# Patient Record
Sex: Female | Born: 1960 | Race: Black or African American | Hispanic: No | Marital: Married
Health system: Southern US, Community
[De-identification: ages and names within clinical notes are randomized; demographics above are authoritative.]

---

## 2014-08-02 DIAGNOSIS — E782 Mixed hyperlipidemia: Secondary | ICD-10-CM | POA: Insufficient documentation

## 2015-01-24 DIAGNOSIS — R7303 Prediabetes: Secondary | ICD-10-CM | POA: Insufficient documentation

## 2015-03-30 DIAGNOSIS — IMO0001 Reserved for inherently not codable concepts without codable children: Secondary | ICD-10-CM | POA: Insufficient documentation

## 2017-12-04 DIAGNOSIS — K219 Gastro-esophageal reflux disease without esophagitis: Secondary | ICD-10-CM | POA: Insufficient documentation

## 2017-12-04 DIAGNOSIS — L2084 Intrinsic (allergic) eczema: Secondary | ICD-10-CM | POA: Insufficient documentation

## 2018-05-04 DIAGNOSIS — R5382 Chronic fatigue, unspecified: Secondary | ICD-10-CM | POA: Insufficient documentation

## 2018-05-04 DIAGNOSIS — N951 Menopausal and female climacteric states: Secondary | ICD-10-CM | POA: Insufficient documentation

## 2018-05-04 DIAGNOSIS — G4700 Insomnia, unspecified: Secondary | ICD-10-CM | POA: Insufficient documentation

## 2018-06-10 DIAGNOSIS — L568 Other specified acute skin changes due to ultraviolet radiation: Secondary | ICD-10-CM | POA: Insufficient documentation

## 2020-04-13 ENCOUNTER — Ambulatory Visit (INDEPENDENT_AMBULATORY_CARE_PROVIDER_SITE_OTHER): Payer: BC Managed Care – PPO

## 2020-04-13 ENCOUNTER — Ambulatory Visit (INDEPENDENT_AMBULATORY_CARE_PROVIDER_SITE_OTHER): Payer: BC Managed Care – PPO | Admitting: Sports Medicine

## 2020-04-13 ENCOUNTER — Other Ambulatory Visit: Payer: Self-pay

## 2020-04-13 DIAGNOSIS — G8929 Other chronic pain: Secondary | ICD-10-CM

## 2020-04-13 DIAGNOSIS — M5442 Lumbago with sciatica, left side: Secondary | ICD-10-CM | POA: Diagnosis not present

## 2020-04-13 DIAGNOSIS — M75101 Unspecified rotator cuff tear or rupture of right shoulder, not specified as traumatic: Secondary | ICD-10-CM | POA: Diagnosis not present

## 2020-04-13 MED ORDER — MELOXICAM 15 MG PO TABS: ORAL_TABLET | ORAL | 3 refills | Status: DC

## 2020-04-13 NOTE — Progress Notes (Signed)
    Procedures performed today:    None.  Independent interpretation of notes and tests performed by another provider:   None.  Brief History, Exam, Impression, and Recommendations:    Chronic low back pain This is a pleasant 59 year old female, for years she has noted asymmetry in her legs, left bigger than right. She has had the appropriate work-up including x-rays, DVT ultrasounds, everything's been negative. She has no cramping in her calf when walking, but she does complain of mild achiness in her back with radiation and a vibration type sensation down her left leg, lateral aspect. I do suspect this is likely radicular, occasionally the loss of autonomic innervation can result in some swelling of the leg. She has a negative Homans' sign today, good pulses. We will start conservatively with lumbar spine x-rays, meloxicam, formal physical therapy followed by MRI and intervention if no better.  Rotator cuff syndrome, right [place] also recalls pulling some weeds, she then developed pain in her right deltoid with overhead activities, later on she started to develop paresthesias into the dorsum of the hand to the second and third fingers. She does have a positive Neer's, Hawkins signs, positive into can sign, positive liftoff test. I think she does have rotator cuff injury, with secondary radiculitis. I did discuss the force coupling function of the rotator cuff, will do x-rays, meloxicam and formal physical therapy for 6 weeks followed by injection if no better.    ___________________________________________ Ihor Austin. Benjamin Stain, M.D., ABFM., CAQSM. Primary Care and Sports Medicine [facility]  Adjunct Instructor of Family Medicine  [employer]

## 2020-04-13 NOTE — Assessment & Plan Note (Signed)
Brianna Curtis also recalls pulling some weeds, she then developed pain in her right deltoid with overhead activities, later on she started to develop paresthesias into the dorsum of the hand to the second and third fingers. She does have a positive Neer's, Hawkins signs, positive into can sign, positive liftoff test. I think she does have rotator cuff injury, with secondary radiculitis. I did discuss the force coupling function of the rotator cuff, will do x-rays, meloxicam and formal physical therapy for 6 weeks followed by injection if no better.

## 2020-04-13 NOTE — Assessment & Plan Note (Signed)
This is a pleasant 59 year old female, for years she has noted asymmetry in her legs, left bigger than right. She has had the appropriate work-up including x-rays, DVT ultrasounds, everything's been negative. She has no cramping in her calf when walking, but she does complain of mild achiness in her back with radiation and a vibration type sensation down her left leg, lateral aspect. I do suspect this is likely radicular, occasionally the loss of autonomic innervation can result in some swelling of the leg. She has a negative Homans' sign today, good pulses. We will start conservatively with lumbar spine x-rays, meloxicam, formal physical therapy followed by MRI and intervention if no better.

## 2020-04-26 ENCOUNTER — Ambulatory Visit: Payer: BC Managed Care – PPO | Admitting: Physical Therapy

## 2020-05-03 ENCOUNTER — Ambulatory Visit (INDEPENDENT_AMBULATORY_CARE_PROVIDER_SITE_OTHER): Payer: BC Managed Care – PPO | Admitting: Physical Therapy

## 2020-05-03 ENCOUNTER — Other Ambulatory Visit: Payer: Self-pay

## 2020-05-03 ENCOUNTER — Encounter: Payer: Self-pay | Admitting: Physical Therapy

## 2020-05-03 DIAGNOSIS — M5442 Lumbago with sciatica, left side: Secondary | ICD-10-CM | POA: Diagnosis not present

## 2020-05-03 DIAGNOSIS — G8929 Other chronic pain: Secondary | ICD-10-CM

## 2020-05-03 DIAGNOSIS — M62838 Other muscle spasm: Secondary | ICD-10-CM | POA: Diagnosis not present

## 2020-05-03 DIAGNOSIS — M25511 Pain in right shoulder: Secondary | ICD-10-CM | POA: Diagnosis not present

## 2020-05-03 DIAGNOSIS — M6281 Muscle weakness (generalized): Secondary | ICD-10-CM | POA: Diagnosis not present

## 2020-05-03 NOTE — Patient Instructions (Addendum)
Access Code: [email or web address] URL: [email or web address] Date: 05/03/2020 Prepared by: Roderic Scarce  Exercises Supine Figure 4 Piriformis Stretch - 1 x daily - 1-2 reps - 30-45 sec hold Supine ITB Stretch with Strap - 1 x daily - 1-2 reps - 30-45 hold Standing Quadratus Lumborum Stretch with Doorway - 1 x daily - 1-2 reps - 30-45 hold Supine Shoulder External Rotation with Resistance - 1 x daily - 3 sets - 10 reps Supine Shoulder Horizontal Abduction with Resistance - 1 x daily - 3 sets - 10 reps Single Arm Doorway Pec Stretch at 90 Degrees Abduction - 1 x daily - 1-2 reps - 30-45 sec hold

## 2020-05-03 NOTE — Therapy (Signed)
[facility] [place] [address] [place], [place], [address] Phone: [phone]   Fax:  [phone]  Physical Therapy Evaluation  Patient Details  Name: Brianna Curtis MRN: [id number] Date of Birth: 08-15-1960 Referring Provider (PT): Dr Roderic Palau   Encounter Date: 05/03/2020   PT End of Session - 05/03/20 1016    Visit Number 1    Number of Visits 6    Date for PT Re-Evaluation 06/14/20    Authorization Type BCBS    PT Start Time 1018    PT Stop Time 1101    PT Time Calculation (min) 43 min    Activity Tolerance Patient tolerated treatment well    Behavior During Therapy [facility] for tasks assessed/performed           History reviewed. No pertinent past medical history.  History reviewed. No pertinent surgical history.  There were no vitals filed for this visit.    Subjective Assessment - 05/03/20 1016    Subjective Pt reports that her symptoms are in the Lt leg - tingling- and that leg is bigger than her Rt.  Md thinks it could be from her back.  Rt shoulder also gives her some pain when she reaches for things. This has been going on for a couple months    Patient Stated Goals reduce pain and be able to use her arm again    Currently in Pain? Yes    Pain Score 2     Pain Location Shoulder    Pain Orientation Right    Pain Descriptors / Indicators Dull;Sore    Pain Type Chronic pain    Pain Radiating Towards into elbow    Pain Onset More than a month ago    Pain Frequency Intermittent    Aggravating Factors  reaching in certain direction    Pain Relieving Factors mobic    Multiple Pain Sites --   no pain in the leg has tingling only into th eleft leg             [facility] PT Assessment - 05/03/20 0001      Assessment   Medical Diagnosis LBP and Rt shoulder pain    Referring Provider (PT) Dr Roderic Palau    Onset Date/Surgical Date 02/01/20    Hand Dominance Right    Next MD Visit PRN    Prior Therapy none        Precautions   Precautions None      Balance Screen   Has the patient fallen in the past 6 months No    Has the patient had a decrease in activity level because of a fear of falling?  No    Is the patient reluctant to leave their home because of a fear of falling?  No      Home Environment   Living Environment Private residence    Living Arrangements Spouse/significant other    Home Access Stairs to enter   aprtment     Prior Function   Level of Independence Independent    Vocation Retired;Part time employment    Buyer, retail once a wk, visual arts    Leisure walk      Observation/Other Assessments   Focus on Therapeutic Outcomes (FOTO)  37% limited      Posture/Postural Control   Posture/Postural Control Postural limitations    Postural Limitations Forward head;Increased lumbar lordosis      ROM / Strength   AROM / PROM / Strength AROM;Strength  AROM   Overall AROM Comments cervical and lumbar WNL except lumbar extension 25% present - all comes from thoracic . Bilat U/LE's WNL       Strength   Overall Strength Comments shoulders WNL except Rt flex 4/5 with pain , LE's WNL       Flexibility   Soft Tissue Assessment /Muscle Length --   Lt hip more stiff than Rt      Palpation   Spinal mobility hypomobile in sacrum and L5 with tenderness    Palpation comment tender in Lt QL and bilat SIJ,       Special Tests   Other special tests (-) SLR and slump, ( +) hawkins kennedy Rt                       Objective measurements completed on examination: See above findings.       [facility] Adult PT Treatment/Exercise - 05/03/20 0001      Exercises   Exercises Other Exercises    Other Exercises  standing QL stretch, piriformis and cross body ITB stretch Lt LE, supine bilat UE ER/horizontal abduction with green band, doorway stretch mid                   PT Education - 05/03/20 1254    Education Details POC, HEP and FOTO     Person(s) Educated Patient    Methods Explanation;Demonstration;Handout    Comprehension Returned demonstration;Verbalized understanding            PT Short Term Goals - 05/03/20 1302      PT SHORT TERM GOAL #1   Title I with inital HEP    Time 3    Period Weeks    Status New    Target Date 05/24/20      PT SHORT TERM GOAL #2   Title demo painfree Rt shoulder ROM    Time 3    Period Weeks    Status New    Target Date 05/24/20             PT Long Term Goals - 05/03/20 1303      PT LONG TERM GOAL #1   Title i with advanced HEP    Time 6    Period Weeks    Status New    Target Date 06/14/20      PT LONG TERM GOAL #2   Title demo Rt shoulder flexion strength 5/5 without pain    Time 6    Period Weeks    Status New    Target Date 06/14/20      PT LONG TERM GOAL #3   Title improve FOTO =/< 29% limited    Time 6    Period Weeks    Status New    Target Date 06/14/20      PT LONG TERM GOAL #4   Title report =/> 75% reduction of Lt LE symptoms    Time 6    Period Weeks    Status New    Target Date 06/14/20      PT LONG TERM GOAL #5   Title pt report =/> 75% reduction of Rt shoulder pain to allow her to return to normal activities    Time 6    Period Weeks    Status New    Target Date 06/14/20                  Plan - 05/03/20  1254    Clinical Impression Statement 59 yo female with h/o LB issues and now tingling into the Lt LE and about 3 months ago after pulling weeds she developed Rt shoulder pain.  Reffered to therapy to have these assessed.  She will come for a 4 visit trial, if no improvement may have an injection in her shoulder.  Pt has stiffness in her Lt hip as compared to Rt , strength is good.  She does have increased lumbar as well.  In her Rt hsoulder she has pain with overhead motion and resisted flexion. (+) hawkins kennedy test and tight pecs with forward shoulders.  She would benefit from PT to address these and restore her normal  activities.    Personal Factors and Comorbidities Comorbidity 2    Comorbidities anxiety, arthritis    Examination-Activity Limitations Reach Overhead;Sleep    Examination-Participation Restrictions Other    Stability/Clinical Decision Making Stable/Uncomplicated    Clinical Decision Making Low    Rehab Potential Good    PT Frequency 1x / week    PT Duration 6 weeks    PT Treatment/Interventions Iontophoresis 4mg /ml Dexamethasone;Patient/family education;Functional mobility training;Moist Heat;Therapeutic exercise;Cryotherapy;Electrical Stimulation;Manual techniques;Dry needling    PT Next Visit Plan progress Rt RTC and scapular strength, pecs stretching, Lt hip manual work    with Plan of Care Patient           Patient will benefit from skilled therapeutic intervention in order to improve the following deficits and impairments:  Pain, Impaired UE functional use, Increased muscle spasms, Decreased strength, Impaired flexibility  Visit Diagnosis: Acute pain of right shoulder - Plan: PT plan of care cert/re-cert  Chronic right-sided low back pain with left-sided sciatica - Plan: PT plan of care cert/re-cert  Muscle weakness (generalized) - Plan: PT plan of care cert/re-cert  Other muscle spasm - Plan: PT plan of care cert/re-cert     Problem List Patient Active Problem List   Diagnosis Date Noted  . Rotator cuff syndrome, right 04/13/2020  . Chronic low back pain 04/13/2020    04/15/2020 PT  05/03/2020, 1:07 PM  [facility] [address] [place], [place], [place] Phone: [phone]   Fax:  [phone]  Name: Brianna Curtis MRN: Jimmey Ralph Date of Birth: [birth year]

## 2020-05-10 ENCOUNTER — Encounter: Payer: Self-pay | Admitting: Physical Therapy

## 2020-05-10 ENCOUNTER — Other Ambulatory Visit: Payer: Self-pay

## 2020-05-10 ENCOUNTER — Ambulatory Visit (INDEPENDENT_AMBULATORY_CARE_PROVIDER_SITE_OTHER): Payer: BC Managed Care – PPO | Admitting: Physical Therapy

## 2020-05-10 DIAGNOSIS — M5442 Lumbago with sciatica, left side: Secondary | ICD-10-CM | POA: Diagnosis not present

## 2020-05-10 DIAGNOSIS — M62838 Other muscle spasm: Secondary | ICD-10-CM

## 2020-05-10 DIAGNOSIS — M25511 Pain in right shoulder: Secondary | ICD-10-CM | POA: Diagnosis not present

## 2020-05-10 DIAGNOSIS — G8929 Other chronic pain: Secondary | ICD-10-CM

## 2020-05-10 DIAGNOSIS — M6281 Muscle weakness (generalized): Secondary | ICD-10-CM | POA: Diagnosis not present

## 2020-05-10 NOTE — Patient Instructions (Signed)
Access Code: [email or web address]: [email or web address]: 10/20/2021Prepared by: [facility] [place]  Supine Shoulder External Rotation with Resistance - 3 x daily - 3 sets - 10 reps  Supine PNF D2 Flexion with Resistance - 1 x daily - 3 x weekly - 3 sets - 10 reps  Supine Shoulder Horizontal Abduction with Resistance - 1 x daily - 3 x weekly - 3 sets - 10 reps  Doorway Pec Stretch at 60 Elevation - 1 x daily - 7 x weekly - 1 sets - 2 reps - 15-20 hold  Doorway Pec Stretch at 90 Degrees Abduction - 1 x daily - 7 x weekly - 1 sets - 2 reps - 15-20 hold  Single Arm Doorway Pec Stretch at 120 Degrees Abduction - 1 x daily - 7 x weekly - 1 sets - 2 reps - 15-20 hold  Supine ITB Stretch with Strap - 1 x daily - 1-2 reps - 30-45 hold  Seated Table Piriformis Stretch - 1 x daily - 7 x weekly - 1 sets - 2 reps - 20 hold

## 2020-05-10 NOTE — Therapy (Signed)
[facility] [place] [address] [place], [place], [address] Phone: [phone]   Fax:  [phone]  Physical Therapy Treatment  Patient Details  Name: Brianna Curtis MRN: [id number] Date of Birth: Dec 19, 1960 Referring Provider (PT): Dr Roderic Palau   Encounter Date: 05/10/2020   PT End of Session - 05/10/20 1432    Visit Number 2    Number of Visits 6    Date for PT Re-Evaluation 06/14/20    Authorization Type BCBS    PT Start Time 1347    PT Stop Time 1430    PT Time Calculation (min) 43 min    Activity Tolerance Patient tolerated treatment well    Behavior During Therapy [facility] for tasks assessed/performed           History reviewed. No pertinent past medical history.  History reviewed. No pertinent surgical history.  There were no vitals filed for this visit.   Subjective Assessment - 05/10/20 1350    Subjective Pt reports she is feeling pretty good today. She started with a personal trainer yesterday; worked on shoulders and hips with resistance.  She only has pain in Lt hip when she gets in bed, and only has pain in shoulder wiht reaching overhead to side.    Currently in Pain? No/denies    Pain Score 0-No pain              [facility] PT Assessment - 05/10/20 0001      Assessment   Medical Diagnosis LBP and Rt shoulder pain    Referring Provider (PT) Dr Roderic Palau    Onset Date/Surgical Date 02/01/20    Hand Dominance Right    Next MD Visit PRN    Prior Therapy none            [facility] Adult PT Treatment/Exercise - 05/10/20 0001      Knee/Hip Exercises: Stretches   ITB Stretch Left;3 reps;Right;2 reps;30 seconds    Piriformis Stretch Right;Left;2 reps;30 seconds    Other Knee/Hip Stretches seated, modified childs pose with lateral trunk flexion x 15 sec each side      Shoulder Exercises: Supine   Horizontal ABduction Strengthening;Both;10 reps;Theraband    Theraband Level (Shoulder Horizontal ABduction)  Level 3 (Green)    External Rotation Both;10 reps;Strengthening   3 sec pause in ER.    Theraband Level (Shoulder External Rotation) Level 3 (Green)    Diagonals Strengthening;Right;Left 10 reps    Theraband Level (Shoulder Diagonals) Level 3 (Green)      Shoulder Exercises: Stretch   Other Shoulder Stretches prolonged stretch with arms abdct 90 deg, then 5 snow angels to tolerance (limited range on RUE)     Other Shoulder Stretches 3 position doorway stretch x 20 sec each. -unilateral RUE x 15 sec with improved tolerance            PT Short Term Goals - 05/03/20 1302      PT SHORT TERM GOAL #1   Title I with inital HEP    Time 3    Period Weeks    Status New    Target Date 05/24/20      PT SHORT TERM GOAL #2   Title demo painfree Rt shoulder ROM    Time 3    Period Weeks    Status New    Target Date 05/24/20             PT Long Term Goals - 05/03/20 1303      PT  LONG TERM GOAL #1   Title i with advanced HEP    Time 6    Period Weeks    Status New    Target Date 06/14/20      PT LONG TERM GOAL #2   Title demo Rt shoulder flexion strength 5/5 without pain    Time 6    Period Weeks    Status New    Target Date 06/14/20      PT LONG TERM GOAL #3   Title improve FOTO =/< 29% limited    Time 6    Period Weeks    Status New    Target Date 06/14/20      PT LONG TERM GOAL #4   Title report =/> 75% reduction of Lt LE symptoms    Time 6    Period Weeks    Status New    Target Date 06/14/20      PT LONG TERM GOAL #5   Title pt report =/> 75% reduction of Rt shoulder pain to allow her to return to normal activities    Time 6    Period Weeks    Status New    Target Date 06/14/20                 Plan - 05/10/20 1713    Clinical Impression Statement Pt demonstrates tightness in Rt pec; added additional stretches to HEP.  All exercises tolerated well, without pain.  Pt required minor cues for form throughout.  Goals are ongoing.    Personal Factors  and Comorbidities Comorbidity 2    Comorbidities anxiety, arthritis    Examination-Activity Limitations Reach Overhead;Sleep    Examination-Participation Restrictions Other    Stability/Clinical Decision Making Stable/Uncomplicated    Rehab Potential Good    PT Frequency 1x / week    PT Duration 6 weeks    PT Treatment/Interventions Iontophoresis 4mg /ml Dexamethasone;Patient/family education;Functional mobility training;Moist Heat;Therapeutic exercise;Cryotherapy;Electrical Stimulation;Manual techniques;Dry needling    PT Next Visit Plan progress Rt RTC and scapular strength, pecs stretching, Lt hip manual work    with Plan of Care Patient           Patient will benefit from skilled therapeutic intervention in order to improve the following deficits and impairments:  Pain, Impaired UE functional use, Increased muscle spasms, Decreased strength, Impaired flexibility  Visit Diagnosis: Acute pain of right shoulder  Chronic right-sided low back pain with left-sided sciatica  Muscle weakness (generalized)  Other muscle spasm     Problem List Patient Active Problem List   Diagnosis Date Noted  . Rotator cuff syndrome, right 04/13/2020  . Chronic low back pain 04/13/2020   04/15/2020, PTA 05/10/20 5:15 PM  [facility] [place] [address] [place], [place], [place] Phone: [phone]   Fax:  [phone]  Name: Brianna Curtis MRN: Jimmey Ralph Date of Birth: 1961/02/23

## 2020-05-17 ENCOUNTER — Other Ambulatory Visit: Payer: Self-pay

## 2020-05-17 ENCOUNTER — Ambulatory Visit (INDEPENDENT_AMBULATORY_CARE_PROVIDER_SITE_OTHER): Payer: BC Managed Care – PPO | Admitting: Physical Therapy

## 2020-05-17 DIAGNOSIS — M25511 Pain in right shoulder: Secondary | ICD-10-CM

## 2020-05-17 DIAGNOSIS — G8929 Other chronic pain: Secondary | ICD-10-CM

## 2020-05-17 DIAGNOSIS — M6281 Muscle weakness (generalized): Secondary | ICD-10-CM

## 2020-05-17 DIAGNOSIS — M62838 Other muscle spasm: Secondary | ICD-10-CM

## 2020-05-17 DIAGNOSIS — M5442 Lumbago with sciatica, left side: Secondary | ICD-10-CM

## 2020-05-17 NOTE — Therapy (Addendum)
[facility] [place], [place], [address] Phone: [phone]   Fax:  [phone]  Physical Therapy Treatment/Discharge  Patient Details  Name: Brianna Curtis MRN: [id number] Date of Birth: 1960/11/10 Referring Provider (PT): Dr Jule Economy   Encounter Date: 05/17/2020   PT End of Session - 05/17/20 1354    Visit Number 3    Number of Visits 6    Date for PT Re-Evaluation 06/14/20    Authorization Type BCBS    PT Start Time 1350    PT Stop Time 1428    PT Time Calculation (min) 38 min    Activity Tolerance Patient tolerated treatment well    Behavior During Therapy [facility] for tasks assessed/performed           No past medical history on file.  No past surgical history on file.  There were no vitals filed for this visit.   Subjective Assessment - 05/17/20 1354    Subjective "I feel great". Pt has been working with a Clinical research associate, and doing well.    Patient Stated Goals reduce pain and be able to use her arm again    Currently in Pain? No/denies    Pain Score 0-No pain              [facility] PT Assessment - 05/17/20 0001      Assessment   Medical Diagnosis LBP and Rt shoulder pain    Referring Provider (PT) Dr Jule Economy    Onset Date/Surgical Date 02/01/20    Hand Dominance Right    Next MD Visit PRN    Prior Therapy none      ROM / Strength   AROM / PROM / Strength Strength      AROM   AROM Assessment Site Shoulder    Right/Left Shoulder Right    Right Shoulder Extension 55 Degrees    Right Shoulder Flexion 150 Degrees    Right Shoulder ABduction 150 Degrees    Right Shoulder Internal Rotation --   thumb to bra strap   Right Shoulder External Rotation 80 Degrees      Strength   Strength Assessment Site Shoulder    Right/Left Shoulder Right    Right Shoulder Flexion 5/5    Right Shoulder Extension 5/5    Right Shoulder ABduction 5/5            [facility] Adult PT Treatment/Exercise -  05/17/20 0001      Knee/Hip Exercises: Stretches   Other Knee/Hip Stretches verbally reviewed LE stretches.       Shoulder Exercises: Standing   Flexion Right;10 reps   to shoulder level; mirror for cues on form/ posture.    Other Standing Exercises OH press with 4# wt x 5 reps, repeated without wt x 8 reps       Shoulder Exercises: Stretch   Internal Rotation Stretch 2 reps   20 sec with strap assist.    Table Stretch -Flexion Limitations 2 reps with hands on counter    Other Shoulder Stretches pec/bicep stretch with hands laced behind back x 20- sec x 2     Other Shoulder Stretches Rt shoulder doorway stretch (low, middle, semi-high position x 20 sec          verbally reviewed other exercises in HEP.     PT Short Term Goals - 05/17/20 1357      PT SHORT TERM GOAL #1   Title I with inital HEP    Time 3  Period Weeks    Status Achieved    Target Date 05/24/20      PT SHORT TERM GOAL #2   Title demo painfree Rt shoulder ROM    Time 3    Period Weeks    Status Achieved    Target Date 05/24/20             PT Long Term Goals - 05/17/20 1354      PT LONG TERM GOAL #1   Title i with advanced HEP    Time 6    Period Weeks    Status Achieved      PT LONG TERM GOAL #2   Title demo Rt shoulder flexion strength 5/5 without pain    Time 6    Period Weeks    Status Achieved      PT LONG TERM GOAL #3   Title improve FOTO =/< 29% limited    Status Achieved      PT LONG TERM GOAL #4   Title report =/> 75% reduction of Lt LE symptoms    Time 6    Period Weeks    Status Achieved      PT LONG TERM GOAL #5   Title pt report =/> 75% reduction of Rt shoulder pain to allow her to return to normal activities    Time 6    Period Weeks    Status Achieved                 Plan - 05/17/20 1429    Clinical Impression Statement Pt demonstrated improved Rt shoulder flexion strength.  Rt shoulder ROM WNL and pain free.  Pt pleased with level of function.  Reviewed  current HEP and added shoulder stretches to areas of tightness.  Pt requests to d/c at this time; has met all goals.    Personal Factors and Comorbidities Comorbidity 2    Comorbidities anxiety, arthritis    Examination-Activity Limitations Reach Overhead;Sleep    Examination-Participation Restrictions Other    Stability/Clinical Decision Making Stable/Uncomplicated    Rehab Potential Good    PT Frequency 1x / week    PT Duration 6 weeks    PT Treatment/Interventions Iontophoresis 66m/ml Dexamethasone;Patient/family education;Functional mobility training;Moist Heat;Therapeutic exercise;Cryotherapy;Electrical Stimulation;Manual techniques;Dry needling    PT Next Visit Plan d/c per pt request.    Consulted and Agree with Plan of Care Patient           Patient will benefit from skilled therapeutic intervention in order to improve the following deficits and impairments:  Pain, Impaired UE functional use, Increased muscle spasms, Decreased strength, Impaired flexibility  Visit Diagnosis: Acute pain of right shoulder  Chronic right-sided low back pain with left-sided sciatica  Muscle weakness (generalized)  Other muscle spasm     Problem List Patient Active Problem List   Diagnosis Date Noted  . Rotator cuff syndrome, right 04/13/2020  . Chronic low back pain 04/13/2020   JKerin Perna PTA 05/17/20 2:34 PM   [place] [address]Phone: [phone]  Fax:  [phone] Name: Brianna MishkinMRN: [id number] of Birth: [birth year]  PHYSICAL THERAPY DISCHARGE SUMMARY  Visits from Start of Care: 3  Current functional level related to goals / functional outcomes: See above for function at last PT visit   Remaining deficits: none   Education / Equipment: HEP Plan: Patient agrees to discharge.  Patient goals were met. Patient is being discharged  due to meeting the  stated rehab goals.  ?????     Jeral Pinch, PT 07/13/20 8:10 AM

## 2020-05-24 ENCOUNTER — Encounter: Payer: BC Managed Care – PPO | Admitting: Physical Therapy

## 2020-05-25 ENCOUNTER — Ambulatory Visit (INDEPENDENT_AMBULATORY_CARE_PROVIDER_SITE_OTHER): Payer: BC Managed Care – PPO | Admitting: Sports Medicine

## 2020-05-25 DIAGNOSIS — G8929 Other chronic pain: Secondary | ICD-10-CM

## 2020-05-25 DIAGNOSIS — M5442 Lumbago with sciatica, left side: Secondary | ICD-10-CM | POA: Diagnosis not present

## 2020-05-25 DIAGNOSIS — M75101 Unspecified rotator cuff tear or rupture of right shoulder, not specified as traumatic: Secondary | ICD-10-CM | POA: Diagnosis not present

## 2020-05-25 DIAGNOSIS — M2041 Other hammer toe(s) (acquired), right foot: Secondary | ICD-10-CM | POA: Diagnosis not present

## 2020-05-25 NOTE — Assessment & Plan Note (Signed)
See prior notes, resolved with physical therapy

## 2020-05-25 NOTE — Assessment & Plan Note (Signed)
As above, resolved with physical therapy.

## 2020-05-25 NOTE — Progress Notes (Signed)
    Procedures performed today:    None.  Independent interpretation of notes and tests performed by another provider:   None.  Brief History, Exam, Impression, and Recommendations:    Chronic low back pain See prior notes, resolved with physical therapy  Rotator cuff syndrome, right As above, resolved with physical therapy.  Hammertoe of second toe of right foot This pleasant 59 year old female had what sounds like a right second PIP dislocation, it was reduced in an orthopedic office. Ultimately she developed a hammertoe, minimal pain but certainly there is a cosmetic deformity. She has tried various types of spacers and pads to hold the toe down but as is typical there is no permanent improvement. She is interested in a surgical correction so I would like her to touch base with Dr. Ardelle Anton. We will tee her up with some x-rays.    ___________________________________________ Brianna Curtis. Benjamin Stain, M.D., ABFM., CAQSM. Primary Care and Sports Medicine [place] [facility]  Adjunct Instructor of Family Medicine  [employer]

## 2020-05-25 NOTE — Assessment & Plan Note (Addendum)
This pleasant 59 year old female had what sounds like a right second PIP dislocation, it was reduced in an orthopedic office. Ultimately she developed a hammertoe, minimal pain but certainly there is a cosmetic deformity. She has tried various types of spacers and pads to hold the toe down but as is typical there is no permanent improvement. She is interested in a surgical correction so I would like her to touch base with Dr. Ardelle Anton. We will tee her up with some x-rays.

## 2020-06-20 ENCOUNTER — Ambulatory Visit: Payer: BC Managed Care – PPO | Admitting: Podiatry

## 2020-06-27 ENCOUNTER — Encounter: Payer: Self-pay | Admitting: Podiatry

## 2020-06-27 ENCOUNTER — Other Ambulatory Visit: Payer: Self-pay

## 2020-06-27 ENCOUNTER — Ambulatory Visit (INDEPENDENT_AMBULATORY_CARE_PROVIDER_SITE_OTHER): Payer: BC Managed Care – PPO

## 2020-06-27 ENCOUNTER — Ambulatory Visit: Payer: BC Managed Care – PPO | Admitting: Podiatry

## 2020-06-27 DIAGNOSIS — M21619 Bunion of unspecified foot: Secondary | ICD-10-CM | POA: Diagnosis not present

## 2020-06-27 DIAGNOSIS — M79671 Pain in right foot: Secondary | ICD-10-CM | POA: Diagnosis not present

## 2020-06-27 DIAGNOSIS — M2041 Other hammer toe(s) (acquired), right foot: Secondary | ICD-10-CM

## 2020-06-27 DIAGNOSIS — M205X1 Other deformities of toe(s) (acquired), right foot: Secondary | ICD-10-CM

## 2020-07-04 NOTE — Progress Notes (Signed)
Subjective:   Patient ID: Jimmey Ralph, female   DOB: 59 y.o.   MRN: [id number]   HPI 59 year old female presents the office today for concerns of a bunion, hammertoe on her right foot.  She states that she dislocated her second toe about 3 years ago and now the toe is going in different directions and rubbing inside of her shoes.  She has tried offloading, shoe modification with insignificant improvement.  She presents today to discuss surgical options.  She has no other concerns today.   Review of Systems  All other systems reviewed and are negative.  History reviewed. No pertinent past medical history.  History reviewed. No pertinent surgical history.   Current Outpatient Medications:  .  estradiol (CLIMARA - DOSED IN MG/24 HR) 0.025 mg/24hr patch, PLACE 1 PATCH ONTO THE SKIN ONCE A WEEK, Disp: , Rfl:  .  fluocinonide ointment (LIDEX) 0.05 %, APPLY TO AFFECTED AREAS TWO TIMES A DAY FOR 2 WEEKS, THEN ONCE DAILY FOR 2 WEEKS AND THEN EVERY OTHER DAY FOR 2 WEEKS **NOT TO FACE**, Disp: , Rfl:  .  phentermine (ADIPEX-P) 37.5 MG tablet, 1 tablet before lunch, Disp: , Rfl:  .  tacrolimus (PROTOPIC) 0.1 % ointment, Apply to affected areas twice daily as needed., Disp: , Rfl:  .  cetirizine (ZYRTEC) 10 MG tablet, Take by mouth., Disp: , Rfl:  .  Cholecalciferol 25 MCG (1000 UT) tablet, Take by mouth., Disp: , Rfl:  .  fluticasone (FLONASE) 50 MCG/ACT nasal spray, Place into the nose., Disp: , Rfl:  .  magnesium oxide (MAG-OX) 400 MG tablet, Take by mouth., Disp: , Rfl:  .  meloxicam (MOBIC) 15 MG tablet, One tab PO qAM with a meal for 2 weeks, then daily prn pain., Disp: 30 tablet, Rfl: 3 .  metFORMIN (GLUCOPHAGE) 500 MG tablet, Take 500 mg by mouth 2 (two) times daily., Disp: , Rfl:  .  Multiple Vitamin (MULTI-VITAMIN) tablet, Take 1 tablet by mouth daily., Disp: , Rfl:  .  NP THYROID 60 MG tablet, Take 60 mg by mouth daily., Disp: , Rfl:  .  Omega-3 Fatty Acids (FISH OIL) 1000 MG CAPS, Take  by mouth., Disp: , Rfl:  .  progesterone (PROMETRIUM) 200 MG capsule, Take 200 mg by mouth at bedtime., Disp: , Rfl:   No Known Allergies        Objective:  Physical Exam  General: AAO x3, NAD  Dermatological: Skin is warm, dry and supple bilateral.  There are no open sores, no preulcerative lesions, no rash or signs of infection present.  Vascular: Dorsalis Pedis artery and Posterior Tibial artery pedal pulses are 2/4 bilateral with immedate capillary fill time. There is no pain with calf compression, swelling, warmth, erythema.   Neruologic: Grossly intact via light touch bilateral.   Musculoskeletal: Severe bunion deformities present with hammertoe deformity the second toe and the second toe is overlapping the hallux.  Second toe is rubbing inside the shoes.  She also gets discomfort on the bunion site.  There is no crepitation with first MPJ range of motion.  Muscular strength 5/5 in all groups tested bilateral.  Gait: Unassisted, Nonantalgic.       Assessment:   Symptomatic bunion, hammertoe with overlapping first and second toes    Plan:  -Treatment options discussed including all alternatives, risks, and complications -Etiology of symptoms were discussed -X-rays were obtained and reviewed with the patient.  Significant bunion is present with hammertoe deformities.  There is no evidence of  acute fracture identified today. -We discussed with conservative as well as surgical treatment options.  We discussed conservative including shoe modifications, offloading padding.  We discussed surgical intervention as well and she wants to proceed with surgery after discussion.  We decided to proceed with a Lapidus bunionectomy on the right side with possible Akin osteotomy, second metatarsal shortening osteotomy with hammertoe repair and K wire fixation. -The incision placement as well as the postoperative course was discussed with the patient. I discussed risks of the surgery which  include, but not limited to, infection, bleeding, pain, swelling, need for further surgery, delayed or nonhealing, painful or ugly scar, numbness or sensation changes, over/under correction, recurrence, transfer lesions, further deformity, hardware failure, DVT/PE, loss of toe/foot. Patient understands these risks and wishes to proceed with surgery. The surgical consent was reviewed with the patient all 3 pages were signed. No promises or guarantees were given to the outcome of the procedure. All questions were answered to the best of my ability. Before the surgery the patient was encouraged to call the office if there is any further questions. The surgery will be performed at the [facility] on an outpatient basis. -We will do preoperative PT to help with crutches, stairs.  Vivi Barrack DPM

## 2020-07-04 NOTE — Addendum Note (Signed)
Addended by: Jacklynn Bue on: 07/04/2020 09:18 AM   Modules accepted: Orders

## 2020-11-21 ENCOUNTER — Other Ambulatory Visit: Payer: Self-pay | Admitting: Sports Medicine

## 2020-11-21 DIAGNOSIS — M5442 Lumbago with sciatica, left side: Secondary | ICD-10-CM

## 2020-11-21 DIAGNOSIS — G8929 Other chronic pain: Secondary | ICD-10-CM

## 2021-05-11 ENCOUNTER — Other Ambulatory Visit: Payer: Self-pay | Admitting: Sports Medicine

## 2021-05-11 DIAGNOSIS — G8929 Other chronic pain: Secondary | ICD-10-CM

## 2021-05-14 NOTE — Telephone Encounter (Signed)
Attempted again, phone rings and hangs up.  AM

## 2021-05-14 NOTE — Telephone Encounter (Signed)
Attempted to call a couple times this morning & phone just rings then hangs up. Will attempt again later to reach patient. AM

## 2021-06-07 ENCOUNTER — Other Ambulatory Visit: Payer: Self-pay | Admitting: Sports Medicine

## 2021-06-07 DIAGNOSIS — M5442 Lumbago with sciatica, left side: Secondary | ICD-10-CM

## 2021-06-07 NOTE — Telephone Encounter (Signed)
Attempted to call once, voicemail was full. Couldn't LVM, will attempt again later. AM

## 2021-06-07 NOTE — Telephone Encounter (Signed)
Attempted to reach patient again, no answer and VM box full. AM

## 2021-11-05 IMAGING — DX DG SHOULDER 2+V*R*
3 series · 3 of 3 positions shown · non-contrast
Comparison: None.

CLINICAL DATA: Acute right shoulder pain.

EXAM:
RIGHT SHOULDER - 2+ VIEW

[shoulder grashey]
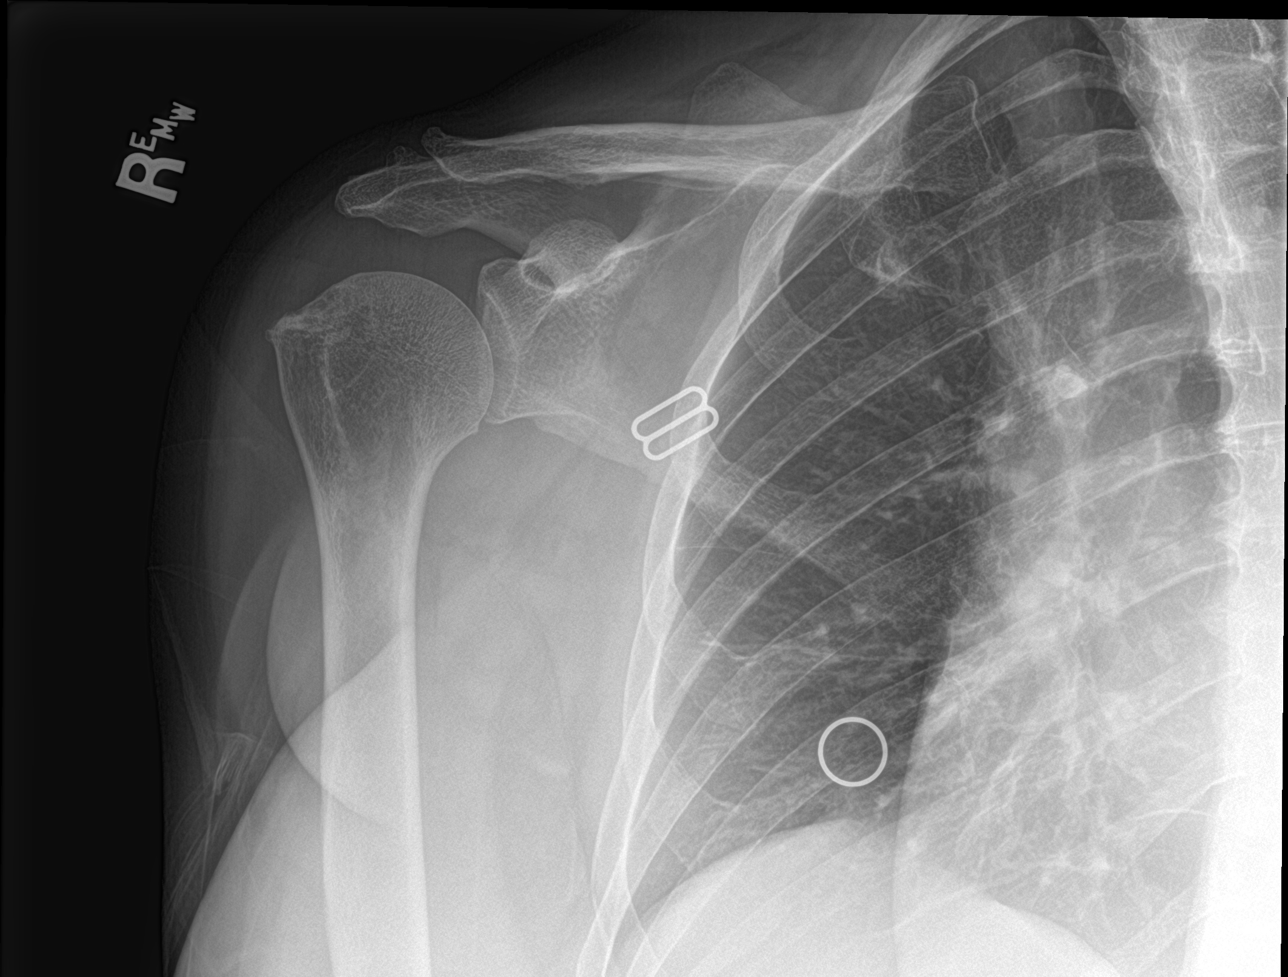

[shoulder y view]
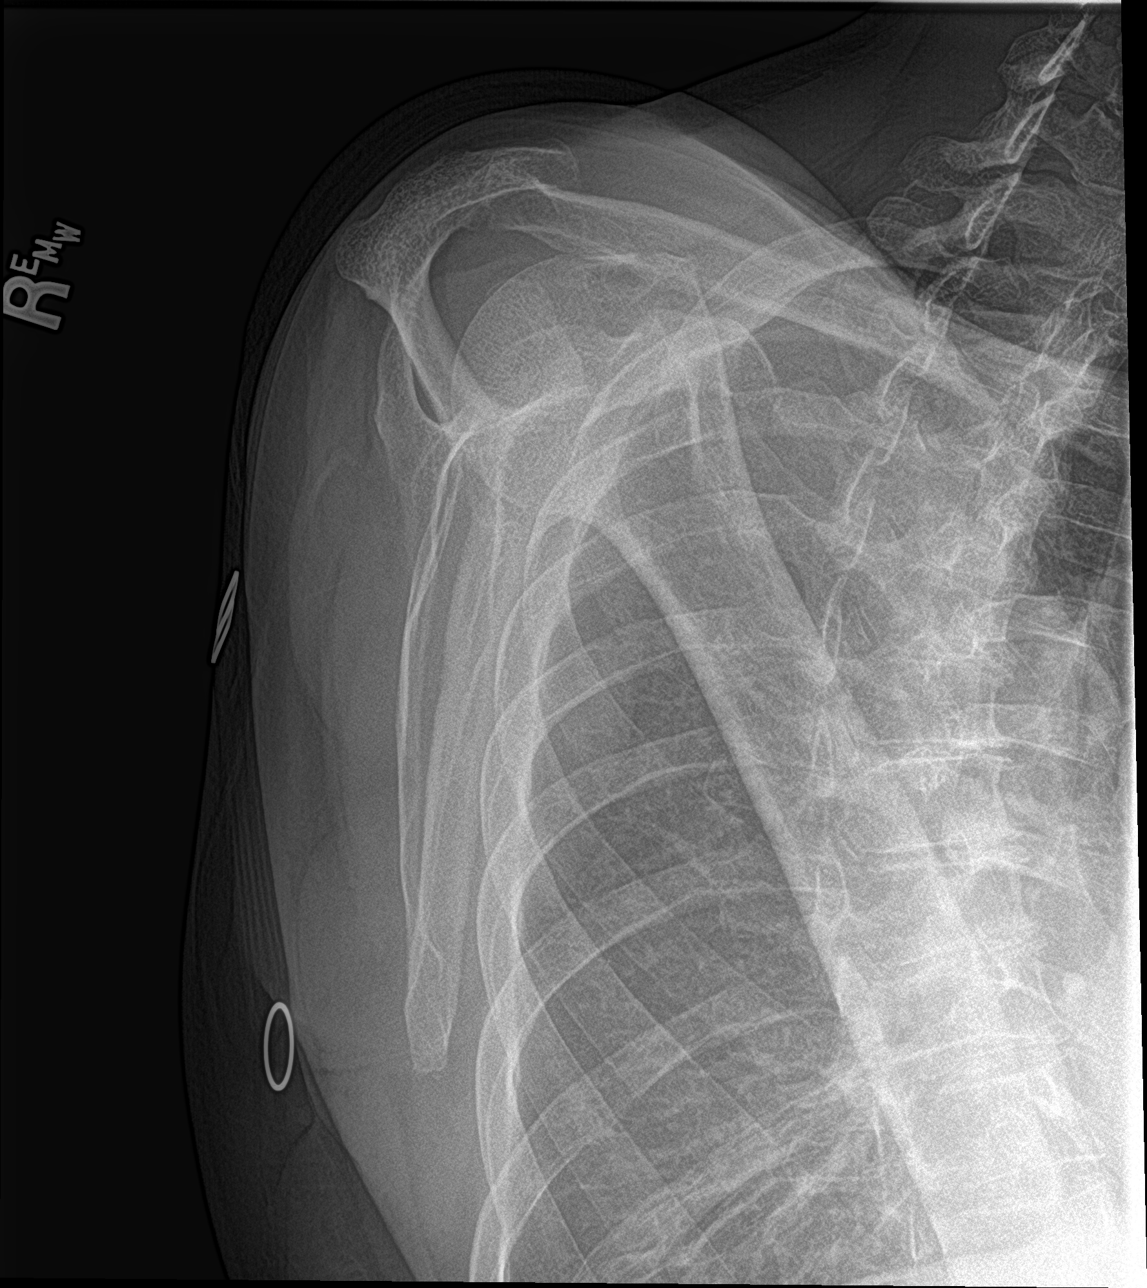

[shoulder axillary]
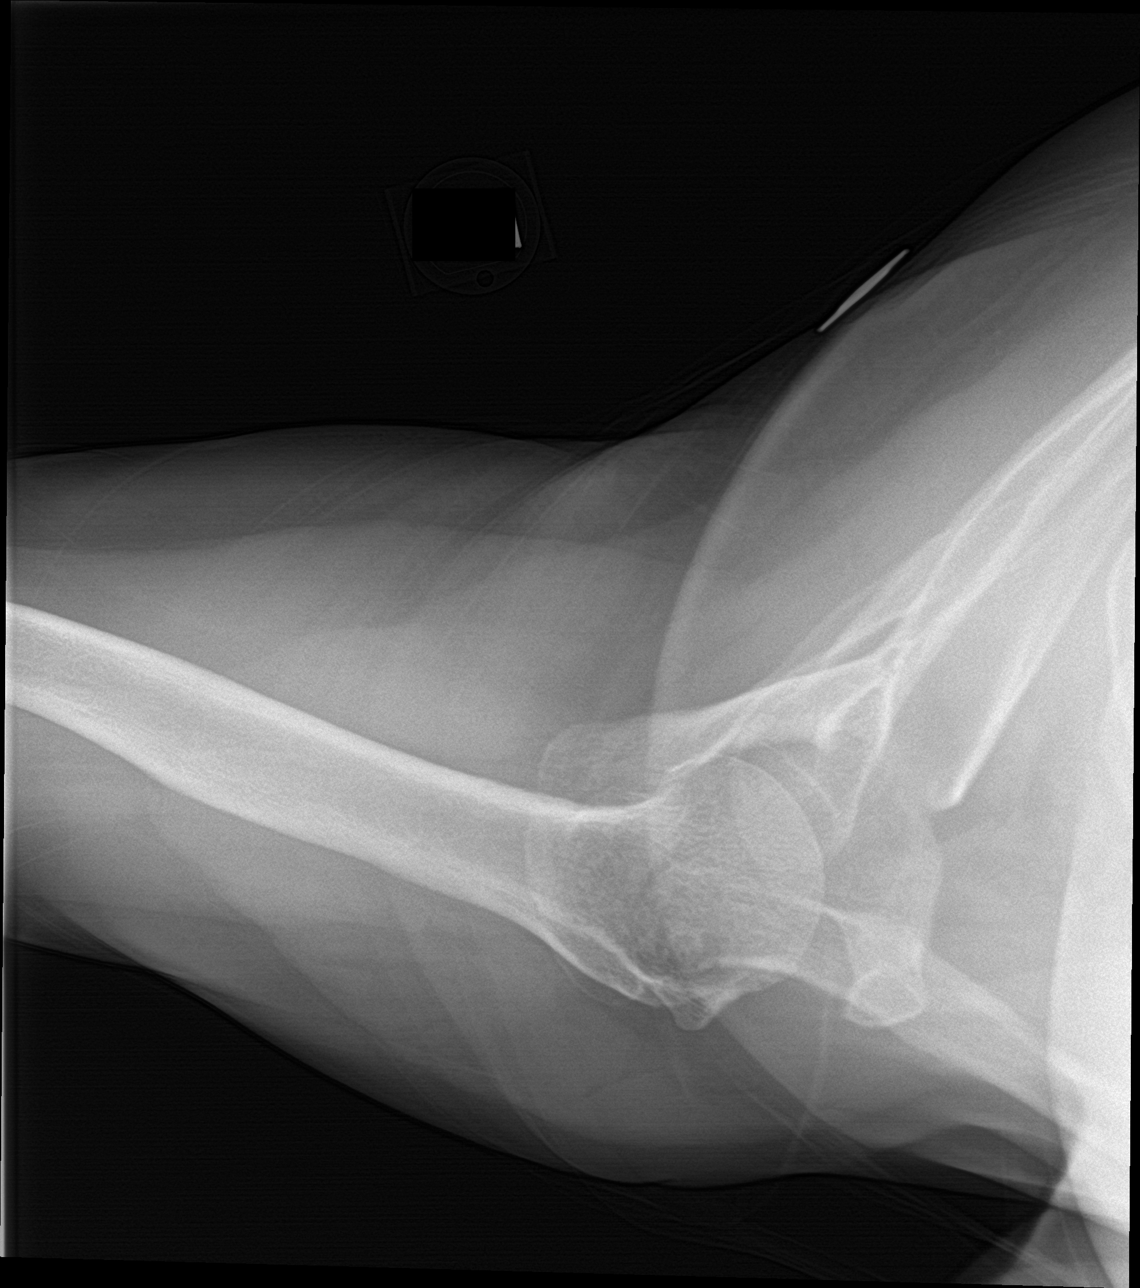

[3 of 3 positions shown; findings below may reference images not displayed]

FINDINGS: There is no evidence of fracture or dislocation. There is no
evidence of arthropathy or other focal bone abnormality. Soft
tissues are unremarkable.
IMPRESSION: Negative.

## 2021-11-05 IMAGING — DX DG LUMBAR SPINE COMPLETE 4+V
5 series · 5 of 5 positions shown · non-contrast
Comparison: None.

CLINICAL DATA: 59-year-old female with chronic bilateral low back
pain. Left side sciatica.

EXAM:
LUMBAR SPINE - COMPLETE 4+ VIEW

[l-spine ap]
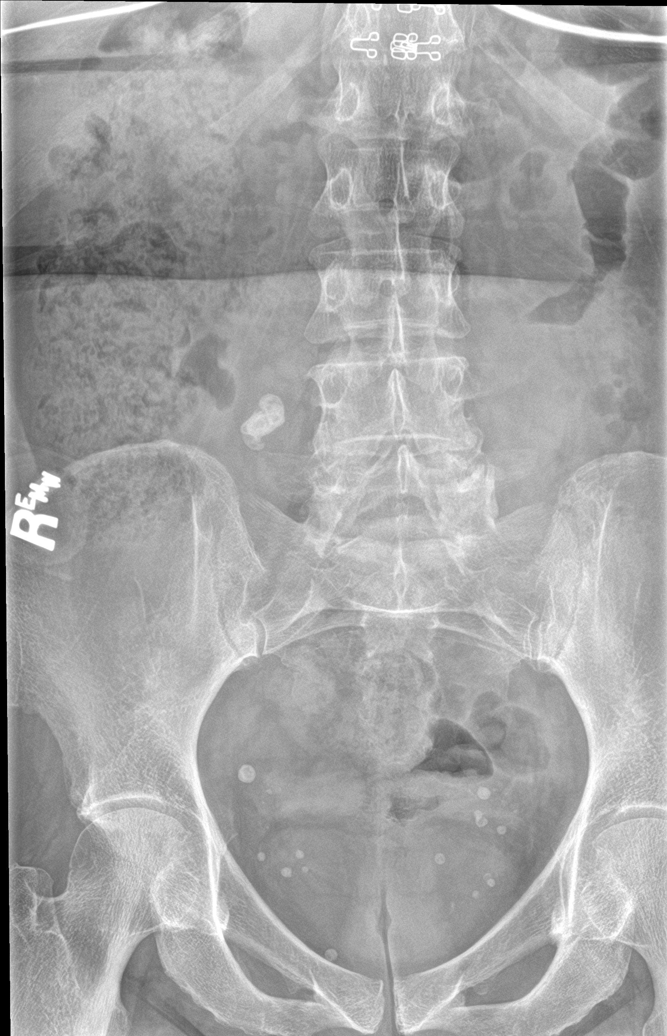

[l-spine obl (1 of 2)]
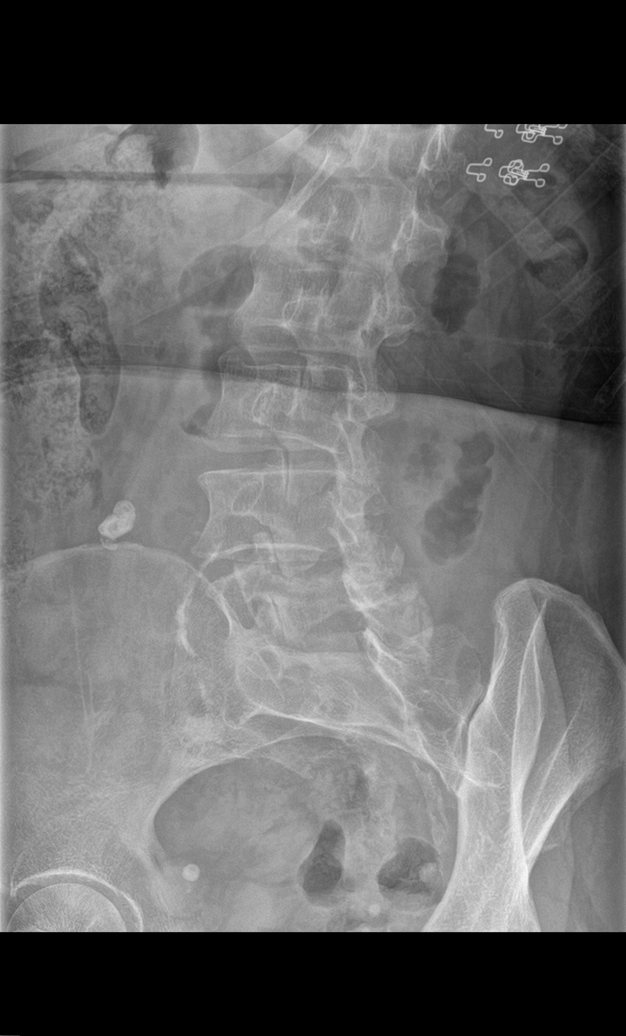

[l-spine obl (2 of 2)]
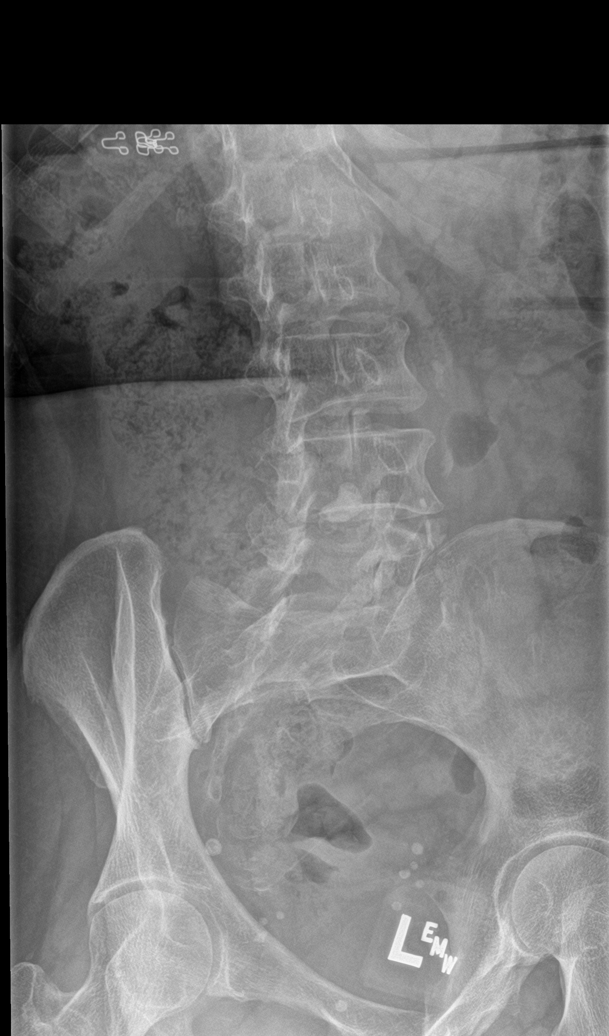

[l-spine lat]
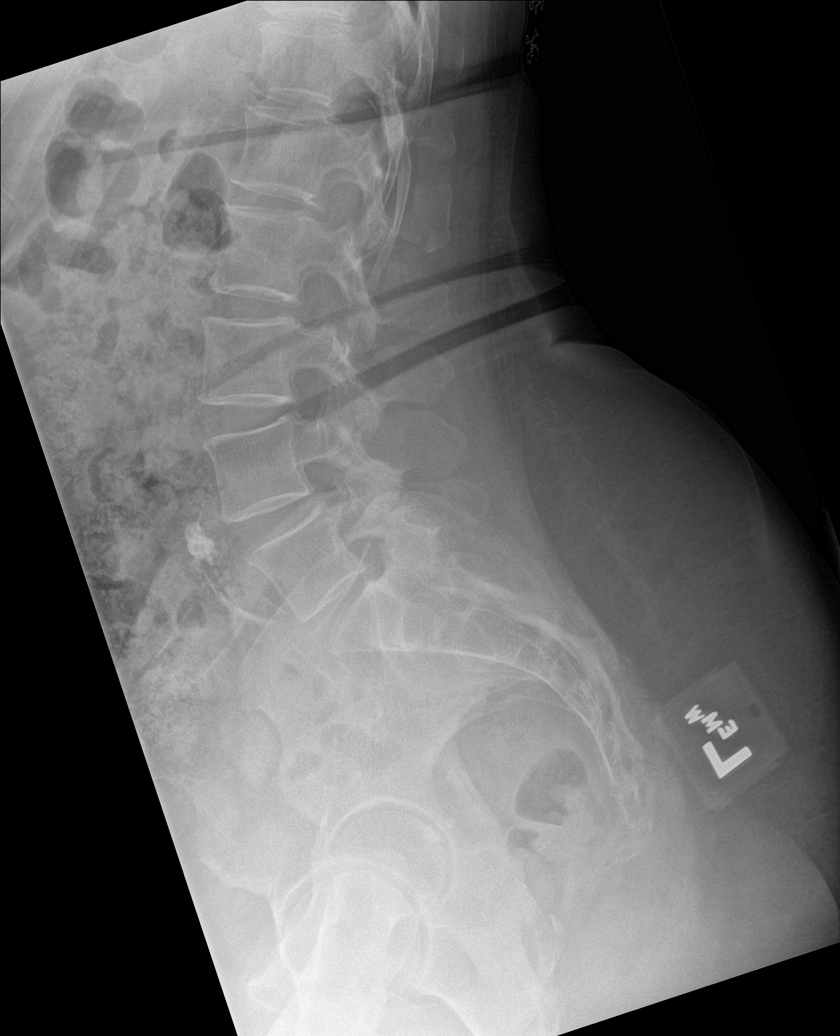

[l-spine spot]
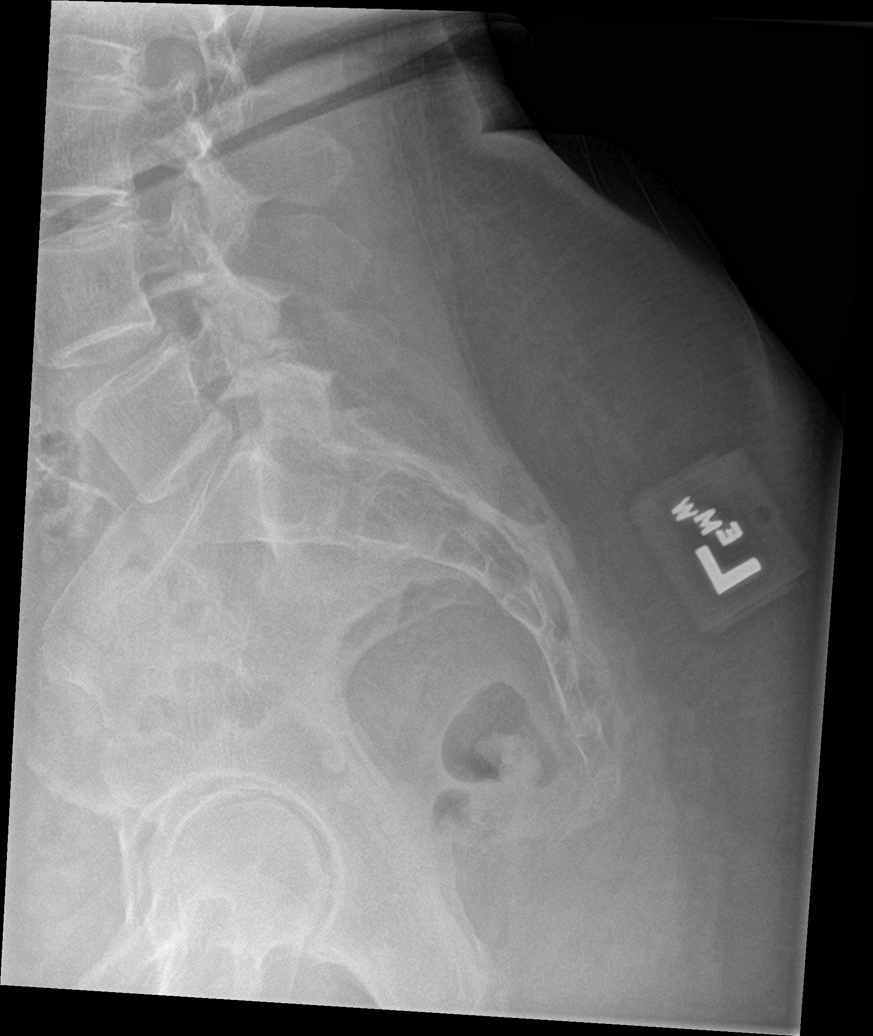

[5 of 5 positions shown; findings below may reference images not displayed]

FINDINGS: Normal lumbar segmentation. Preserved lumbar lordosis. Relatively
preserved disc spaces. No pars fracture. Mild facet hypertrophy at
L5-S1. Visible sacrum and SI joints appear within normal limits. No
acute osseous abnormality identified.

Numerous pelvic phleboliths. A bulky and lobulated 2 cm
calcification in the right prevertebral space might be a large
gonadal vein phlebolith, or chronically calcified postinflammatory
lymph node uncertain. Superimposed aortoiliac bifurcation calcified
atherosclerosis.

Other visible abdominal and pelvic visceral contours appear
negative.
IMPRESSION: 1. No acute osseous abnormality identified in the lumbar spine.
2. Relatively preserved disc spaces. Mild bilateral facet
degeneration at L5-S1.
3. Lobulated 2 cm right prevertebral calcification, significance and
etiology unclear but possibly a large gonadal vein phlebolith or a
calcified postinflammatory lymph node.

## 2021-12-19 ENCOUNTER — Other Ambulatory Visit: Payer: Self-pay | Admitting: Sports Medicine

## 2021-12-19 DIAGNOSIS — G8929 Other chronic pain: Secondary | ICD-10-CM
